# Patient Record
Sex: Male | Born: 1965 | Race: Black or African American | Hispanic: No
Health system: Southern US, Community
[De-identification: ages and names within clinical notes are randomized; demographics above are authoritative.]

---

## 2009-12-15 ENCOUNTER — Inpatient Hospital Stay: Payer: Self-pay | Admitting: Internal Medicine

## 2010-02-17 ENCOUNTER — Emergency Department: Payer: Self-pay | Admitting: Emergency Medicine

## 2011-01-23 IMAGING — CR DG CHEST 1V PORT
1 series · 1 of 1 positions shown · non-contrast
Comparison: none

REASON FOR EXAM: weakness, elevated troponin
COMMENTS:

PROCEDURE:     DXR - DXR PORTABLE CHEST SINGLE VIEW  - December 15, 2009  [DATE]
RESULT:     Comparison: None

[view not recorded]
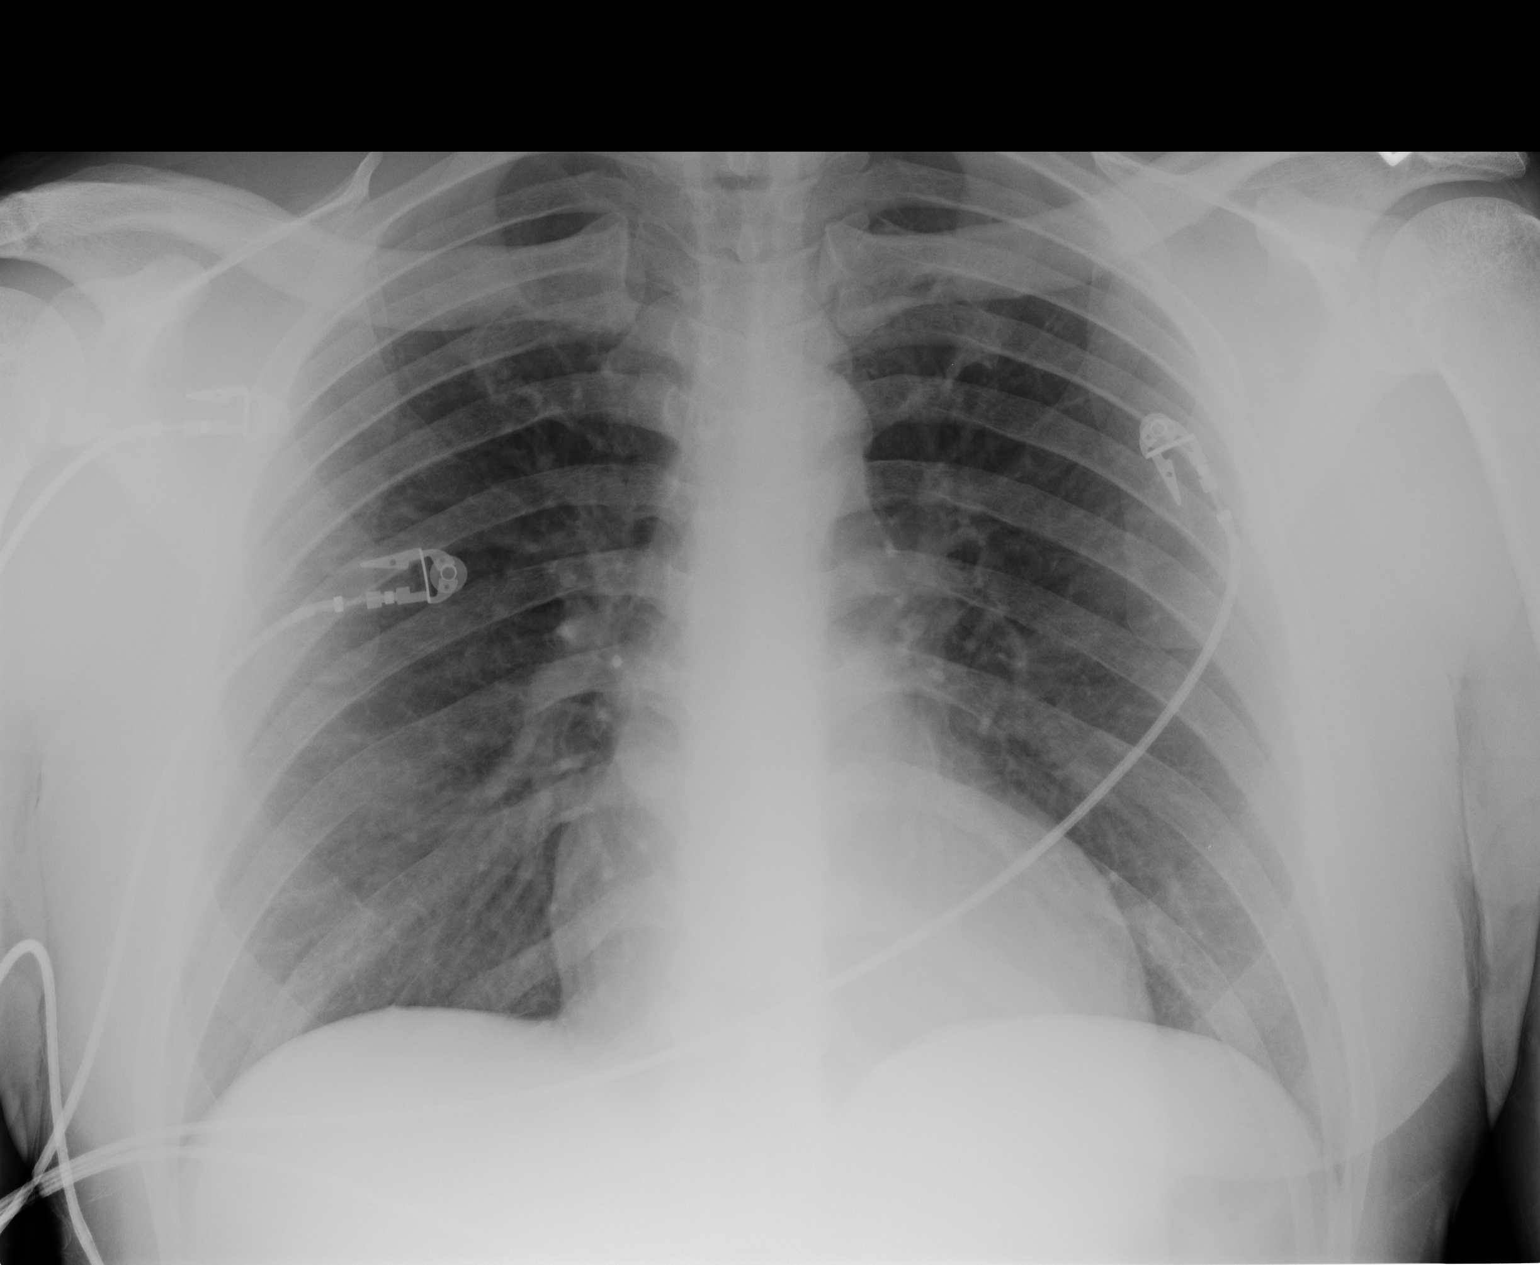

[1 of 1 positions shown; findings below may reference images not displayed]

FINDINGS: Single portable AP chest radiograph is provided. There is no focal
parenchymal opacity, pleural effusion, or pneumothorax. Normal
cardiomediastinal silhouette. The osseous structures are unremarkable.
IMPRESSION: No acute disease of the chest.

## 2011-06-01 ENCOUNTER — Emergency Department: Payer: Self-pay

## 2011-12-27 ENCOUNTER — Observation Stay: Payer: Self-pay | Admitting: Internal Medicine

## 2011-12-27 LAB — DRUG SCREEN, URINE
Amphetamines, Ur Screen: NEGATIVE (ref ?–1000)
Barbiturates, Ur Screen: NEGATIVE (ref ?–200)
Benzodiazepine, Ur Scrn: NEGATIVE (ref ?–200)
Cannabinoid 50 Ng, Ur ~~LOC~~: NEGATIVE (ref ?–50)
Methadone, Ur Screen: NEGATIVE (ref ?–300)
Opiate, Ur Screen: NEGATIVE (ref ?–300)
Phencyclidine (PCP) Ur S: NEGATIVE (ref ?–25)
Tricyclic, Ur Screen: NEGATIVE (ref ?–1000)

## 2011-12-27 LAB — COMPREHENSIVE METABOLIC PANEL
Alkaline Phosphatase: 82 U/L (ref 50–136)
Bilirubin,Total: 0.2 mg/dL (ref 0.2–1.0)
Calcium, Total: 8.6 mg/dL (ref 8.5–10.1)
Chloride: 106 mmol/L (ref 98–107)
Co2: 29 mmol/L (ref 21–32)
EGFR (African American): >60
EGFR (Non-African Amer.): >60
Glucose: 97 mg/dL (ref 65–99)
SGOT(AST): 28 U/L (ref 15–37)
SGPT (ALT): 32 U/L
Sodium: 143 mmol/L (ref 136–145)

## 2011-12-27 LAB — CBC
MCH: 28.3 pg (ref 26.0–34.0)
MCHC: 33.8 g/dL (ref 32.0–36.0)
MCV: 84 fL (ref 80–100)
RBC: 5.13 10*6/uL (ref 4.40–5.90)
WBC: 5.1 10*3/uL (ref 3.8–10.6)

## 2011-12-27 LAB — HEMOGLOBIN A1C: Hemoglobin A1C: 5.9 % (ref 4.2–6.3)

## 2011-12-27 LAB — CK TOTAL AND CKMB (NOT AT ARMC)
CK, Total: 178 U/L (ref 35–232)
CK, Total: 147 U/L (ref 35–232)
CK-MB: 2.5 ng/mL (ref 0.5–3.6)

## 2011-12-27 LAB — TROPONIN I
Troponin-I: 0.75 ng/mL — ABNORMAL HIGH
Troponin-I: 0.7 ng/mL — ABNORMAL HIGH

## 2011-12-28 LAB — LIPID PANEL
Cholesterol: 147 mg/dL (ref 0–200)
HDL Cholesterol: 28 mg/dL — ABNORMAL LOW (ref 40–60)
Ldl Cholesterol, Calc: 76 mg/dL (ref 0–100)
Triglycerides: 217 mg/dL — ABNORMAL HIGH (ref 0–200)
VLDL Cholesterol, Calc: 43 mg/dL — ABNORMAL HIGH (ref 5–40)

## 2011-12-28 LAB — CK TOTAL AND CKMB (NOT AT ARMC)
CK, Total: 125 U/L (ref 35–232)
CK-MB: 2.3 ng/mL (ref 0.5–3.6)

## 2011-12-28 LAB — TROPONIN I: Troponin-I: 0.7 ng/mL — ABNORMAL HIGH

## 2013-02-03 IMAGING — CR DG CHEST 2V
1 series · 2 of 2 positions shown · non-contrast
Comparison: none

REASON FOR EXAM: palpitations
COMMENTS:   May transport without cardiac monitor

PROCEDURE:     DXR - DXR CHEST PA (OR AP) AND LATERAL  - December 27, 2011 [DATE]
RESULT:     Comparison is made to the prior exam of 12/15/2009. The lung
fields are clear. The heart, mediastinal and osseous structures show no
significant abnormalities.

[Series 1: pa · 0.17mm/px · 2 of 2 slices shown]
[im 1/2]
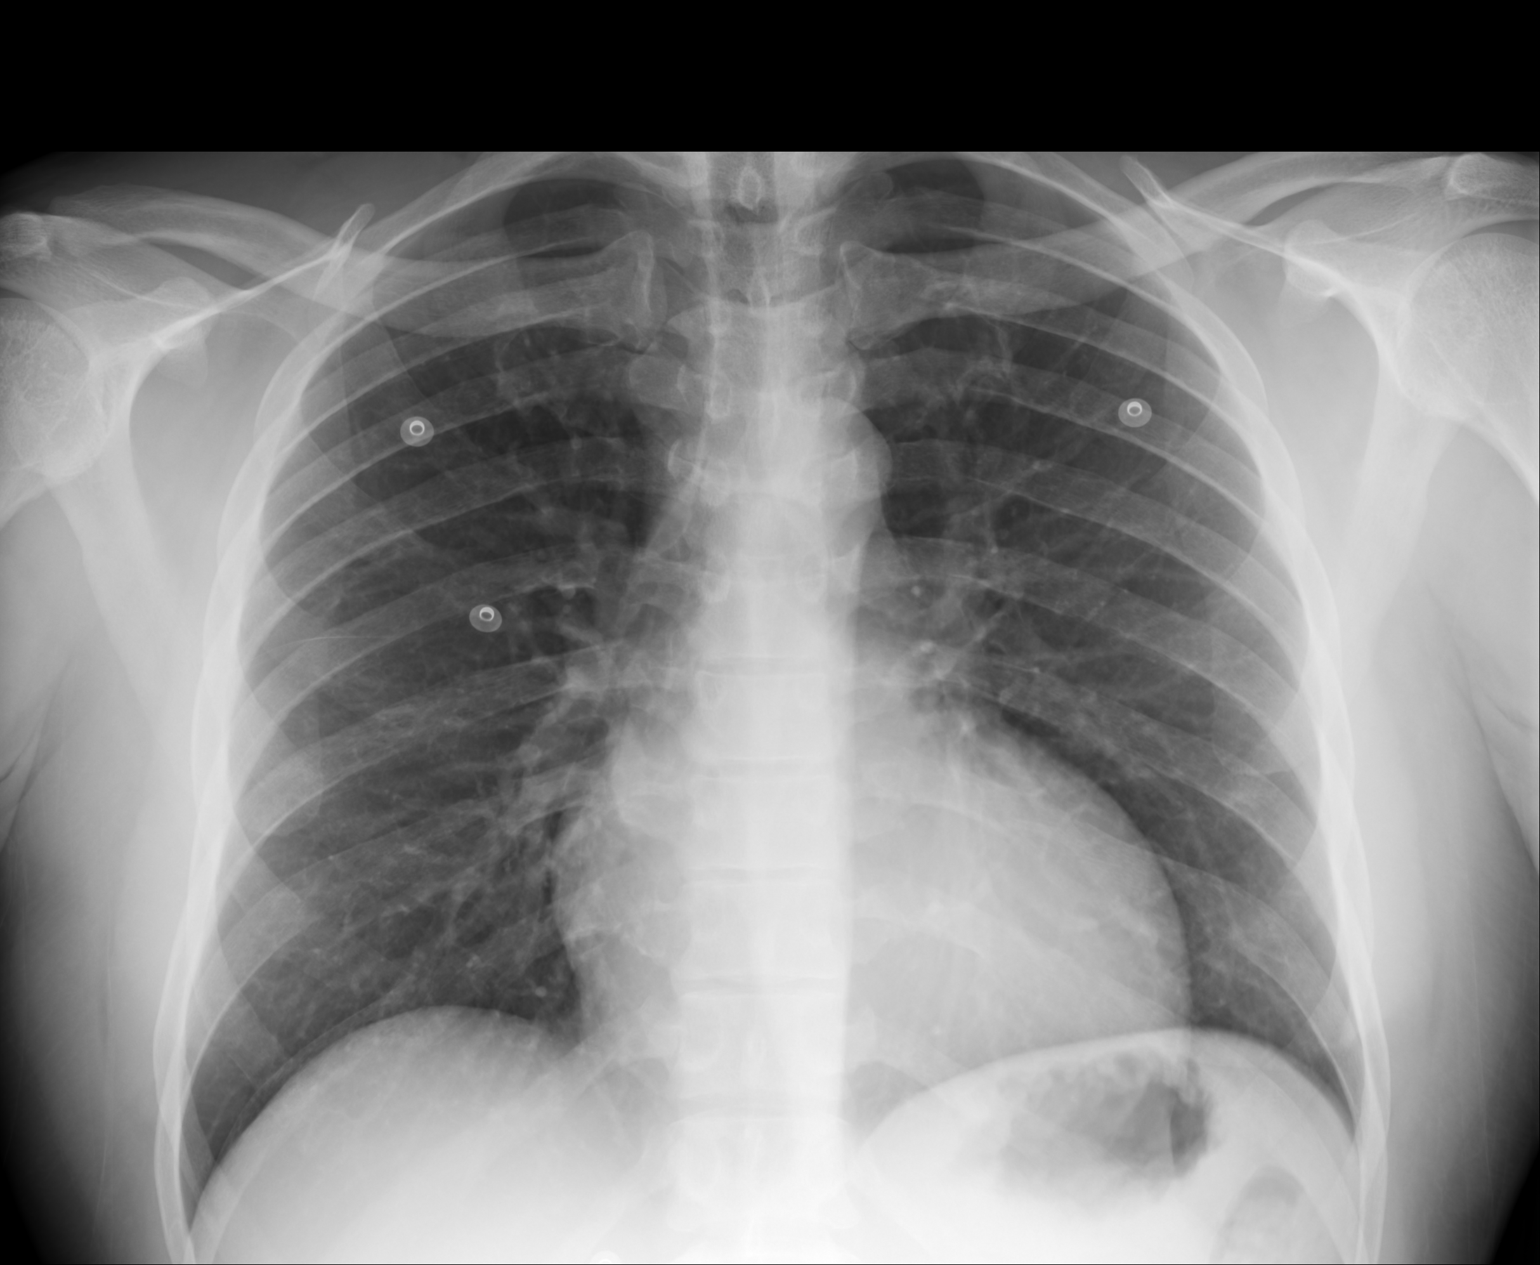
[im 2/2]
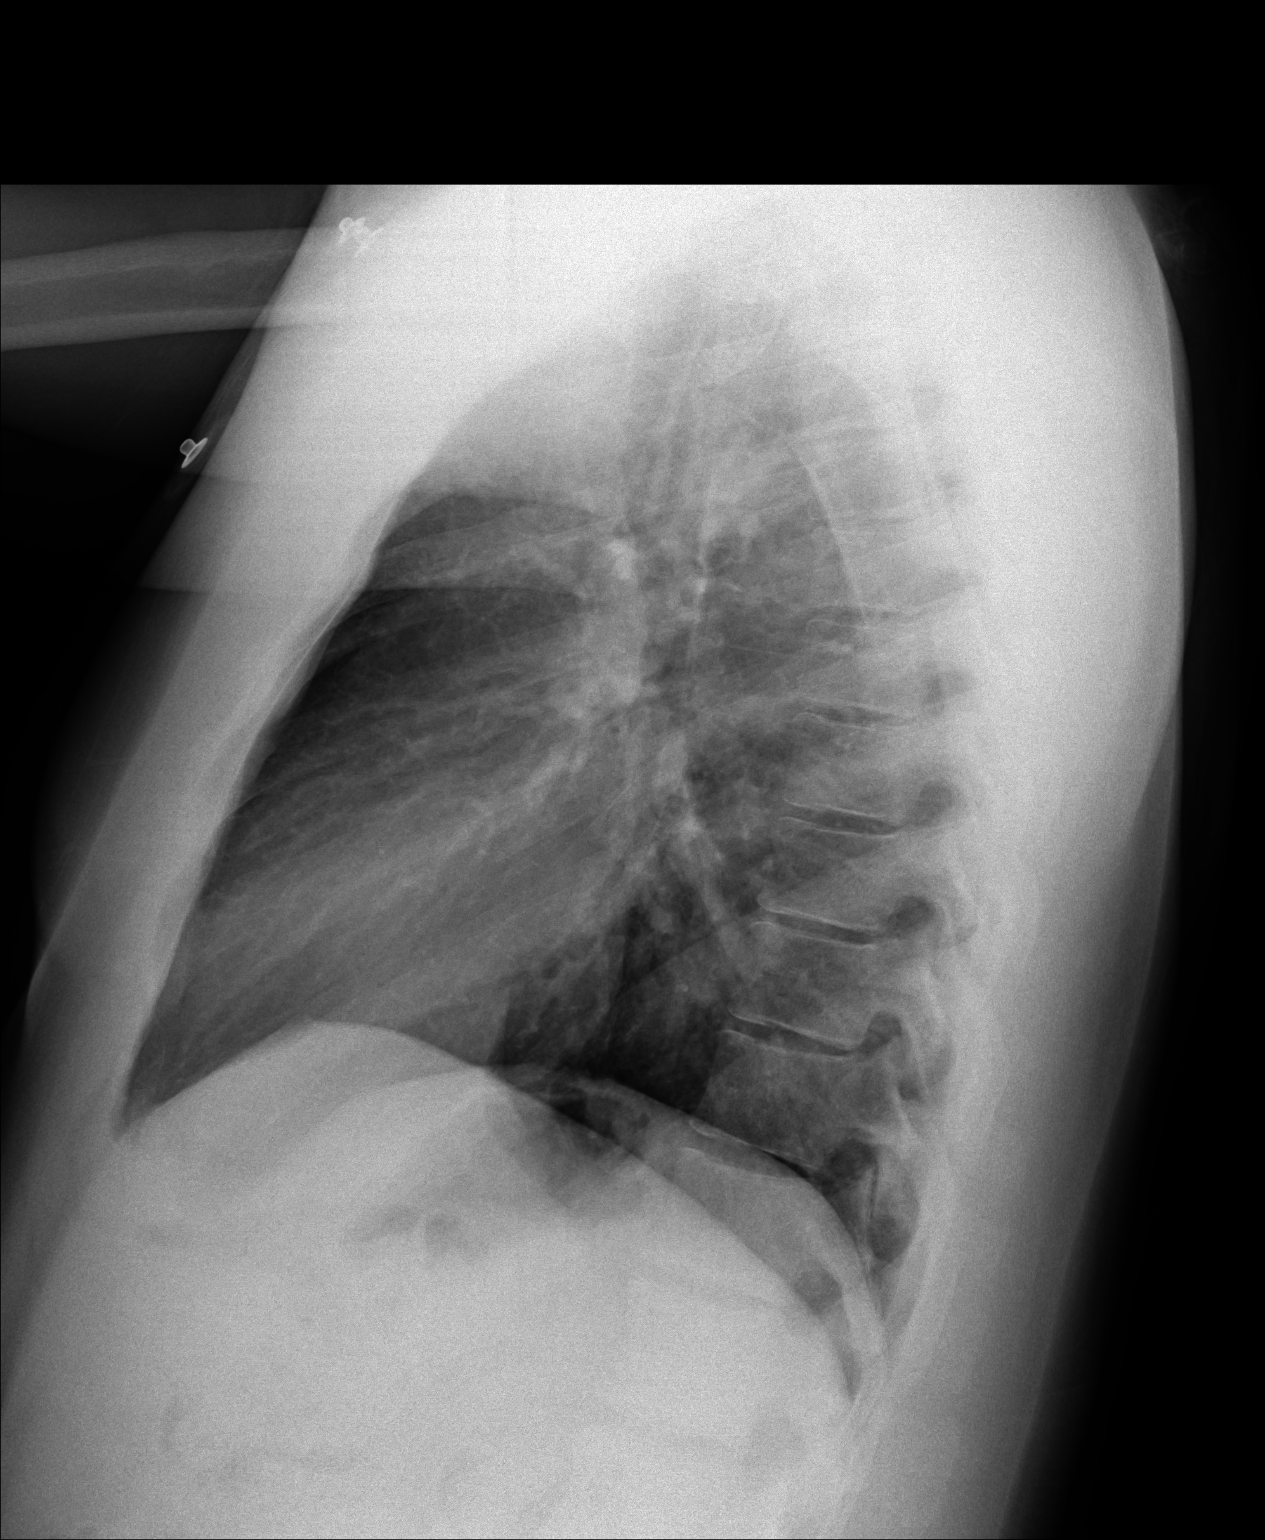

[2 of 2 positions shown; findings below may reference images not displayed]

IMPRESSION: 1.     No acute changes are identified.

## 2015-03-15 NOTE — Discharge Summary (Signed)
PATIENT NAME:  Kurt Reed, Kurt Reed MR#:  [id number] DATE OF BIRTH:  [birth year]  DATE OF ADMISSION:  12/27/2011 DATE OF DISCHARGE:  12/28/2011  ADMITTING PHYSICIAN: Dr. Hilda LiasVivek Sainani.  DISCHARGING PHYSICIAN: Dr. Larena GlassmanAmir Americus Perkey.  PRIMARY CARE PHYSICIAN: None.   ADMITTING DIAGNOSIS: Palpitations, shortness of breath.   DISCHARGE DIAGNOSES:  1. Palpitations. 2. Shortness of breath. 3. Malignant hypertension. 4. 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  5. Smoking. 6. Gastroesophageal reflux disease.   CONSULTANTS:  1. Dr. Juliann Paresallwood. 2. Case management.   TESTS DONE DURING THIS HOSPITALIZATION:  1. Chest x-ray 02/05 showed no acute changes identified. 2. Echo Doppler was done by Dr. Juliann Paresallwood who stated this was no significant finding, but still waiting for official report.   HOSPITAL COURSE: Initial history and physical were done by Dr. Cherlynn KaiserSainani. Please refer to his note dated 12/27/2011 for complete details. In brief, this is a 49 year old African American male with past medical history of cocaine abuse, smoking, alcohol, who presents with intermittent shortness of breath and palpitations. The patient has a history of cocaine and alcohol abuse. He had an ongoing detox program at [facility] who was admitted to the hospitalist service.  1. For his shortness of breath, this resolved and he did have elevated troponin. I am not sure if this was anginal equivalent, but according to Dr. Juliann Paresallwood, he did not believe so. He had aspirin, beta blocker, LDL 76. The patient's serial cardiac enzymes showed some mild troponin leak which may have been from demand ischemia from malignant hypertension. The patient was recommended to continue to abstain from cocaine as he was on beta blocker. 2. Malignant hypertension. The patient did not take any medications. He has a history of hypertension. Restarted on beta blocker and advised him carefully. He did receive some p.r.n. hydralazine and now his blood pressure has been  fairly well controlled with beta blocker.  3. History of cocaine and alcohol abuse. The patient is currently on detox program. 4. Palpitations, shortness of breath. This resolved.  5. Smoking. The patient was counseled about cessation for over three minutes.  6. Deep venous thrombosis prophylaxis. The patient was ambulatory and on aspirin.  7. Gastroesophageal reflux disease. Started on Zantac.   CONDITION ON DISCHARGE: The patient was discharged on 12/28/2011. Temperature 96.7, heart rate 55, respiratory rate 18, blood pressure 118/74, sating 98% on room air. LUNGS: Clear to auscultation. CARDIOVASCULAR: Regular rate and rhythm. ABDOMEN: Benign.   DISCHARGE MEDICATIONS:  1. Aspirin 81 mg daily.  2. Zantac 150 mg daily. 3. Metoprolol 25 mg twice daily.   OXYGEN: None.   DIET: Low sodium.   ACTIVITY: As tolerated.   FOLLOWUP: The patient should see [facility] in one week, see Dr. Juliann Paresallwood in two weeks.   CODE STATUS: FULL CODE.    [facility].   Thank you for allowing me to participate in the care of this patient.       TOTAL TIME SPENT ON DISCHARGE: 45 minutes.    ____________________________ Corie ChiquitoAmir A. Lafayette DragonFirozvi, MD aaf:ap Reed: 12/29/2011 10:24:09 ET T: 12/29/2011 10:36:27 ET JOB#: [id number]  cc: Karolee OhsAmir A. Lafayette DragonFirozvi, MD, <Dictator> [facility]  Dwayne Reed. Juliann Paresallwood, MD Coralyn PearAMIR A Vangie Henthorn MD ELECTRONICALLY SIGNED 12/29/2011 14:17

## 2015-03-15 NOTE — Consult Note (Signed)
PATIENT NAME:  Kurt Reed, [name] MR#:  [id number] DATE OF BIRTH:  December 13, 1965  DATE OF CONSULTATION:  12/28/2011  REFERRING PHYSICIAN:   Rolly PancakeVivek J. Cherlynn KaiserSainani, MD with [facility] CONSULTING PHYSICIAN:  Hodaya Curto D. Vena Bassinger, MD  REASON FOR CONSULTATION: Possible non-Q-wave myocardial infarction, shortness of breath, palpitations.   HISTORY OF PRESENT ILLNESS: Mr. Kurt Reed is a 49 year old African American male who presents to the hospital with intermittent episodes of chest pain and palpitations. The patient has a history of cocaine and alcohol abuse, currently going through detox and treatment. He had been doing well until a few days ago when he started having intermittent episodes of shortness of breath and palpitations lasting only for a few seconds and going away. He got very concerned and came to Emergency Room for further evaluation. He was pain-free when he came to the ER, blood pressure and heart rate were okay; and he denied any significant chest pain, but with a previous history he decided to come for further evaluation.   REVIEW OF SYSTEMS: He denies blackout spells or syncope. He denies fever, chills, or sweats. He denies weight loss, weight gain. No hemoptysis or hematemesis. He denies bright red blood per rectum. No vision change or hearing change. Denies sputum production or cough.   PAST MEDICAL HISTORY:  1. Hypertension. 2. Sickle cell trait. 3. History of substance abuse.  4. Hypertension.  5. Smoking.   ALLERGIES: Lisinopril.  FAMILY HISTORY: Alcohol abuse, diabetes.   SOCIAL HISTORY: He smokes. He has some alcohol abuse and substance abuse history. He is currently unemployed.   MEDICATIONS: None.   PHYSICAL EXAMINATION:  VITAL SIGNS: Blood pressure 160/80, pulse 70, respiratory rate 18, afebrile.   HEENT: Normocephalic, atraumatic. Pupils are equal, round and reactive to light.    NECK: Supple. No significant jugular venous distention, bruits or adenopathy.   LUNGS: Clear to  auscultation and percussion. No rhonchi or rales.   HEART: Regular rate and rhythm. Positive S4. Systolic ejection murmur at the apex.   ABDOMEN: Exam is benign.   EXTREMITIES: Exam is within normal limits.   NEUROLOGIC: Exam is intact.   SKIN: Exam is normal.   LABORATORY, DIAGNOSTIC AND RADIOLOGICAL DATA:  Glucose 97, BUN 15, creatinine 1.1, sodium 143, potassium 4.7, bicarbonate 29.  LFTs negative.  Troponin 0.75. CK 178.  White count 5.1, hemoglobin 14.5, hematocrit 43, platelet count 170.  EKG: Normal sinus rhythm, left ventricular hypertrophy, nonspecific ST-T wave changes.   ASSESSMENT:  1. Substance abuse. 2. Cocaine abuse. 3. Chest pain, elevated troponin, shortness of breath.  4. Malignant hypertension.  5. Alcohol abuse.  6. Abnormal EKG.   PLAN:  1. I agree with admit, rule out for myocardial infarction.  2. Follow cardiac enzymes. Follow-up EKG. Follow troponins.  3. Continue blood pressure control.  4. I advised the patient to quit smoking.  5. Lipid management. 6. Echocardiogram may be helpful.  7. We will consider further evaluation depending on troponins and cardiac enzymes studies.  8. Would increase activity if he remains pain free.   ____________________________ Bobbie Stackwayne D. Juliann Paresallwood, MD ddc:cbb D: 01/02/2012 13:49:00 ET T: 01/02/2012 14:04:51 ET JOB#: [id number]  cc: Tiea Manninen D. Juliann Paresallwood, MD, <Dictator>  Alwyn PeaWAYNE D Sissi Padia MD ELECTRONICALLY SIGNED 01/19/2012 7:34

## 2015-03-15 NOTE — Consult Note (Signed)
PATIENT NAME:  Kurt Reed, Kurt Reed MR#:  [id number] DATE OF BIRTH:  1966/05/03  DATE OF CONSULTATION:  12/27/2011  REFERRING PHYSICIAN:   Rolly PancakeVivek J. Cherlynn KaiserSainani, MD with [facility] CONSULTING PHYSICIAN:  [employer] Reed. Audley Hinojos, MD  REASON FOR CONSULTATION: Possible non-Q-wave myocardial infarction, shortness of breath, palpitations.   HISTORY OF PRESENT ILLNESS: Kurt Reed is a 49 year old African American male who presents to the hospital with intermittent episodes of chest pain and palpitations. The patient has a history of cocaine and alcohol abuse, currently going through detox and treatment. He had been doing well until a few days ago when he started having intermittent episodes of shortness of breath and palpitations lasting only for a few seconds and going away. He got very concerned and came to Emergency Room for further evaluation. He was pain-free when he came to the ER, blood pressure and heart rate were okay; and he denied any significant chest pain, but with a previous history he decided to come for further evaluation.   REVIEW OF SYSTEMS: He denies blackout spells or syncope. He denies fever, chills, or sweats. He denies weight loss, weight gain. No hemoptysis or hematemesis. He denies bright red blood per rectum. No vision change or hearing change. Denies sputum production or cough.   PAST MEDICAL HISTORY:  1. Hypertension. 2. Sickle cell trait. 3. History of substance abuse.  4. Hypertension.  5. Smoking.   ALLERGIES: Lisinopril.  FAMILY HISTORY: Alcohol abuse, diabetes.   SOCIAL HISTORY: He smokes. He has some alcohol abuse and substance abuse history. He is currently unemployed.   MEDICATIONS: None.   PHYSICAL EXAMINATION:  VITAL SIGNS: Blood pressure 160/80, pulse 70, respiratory rate 18, afebrile.   HEENT: Normocephalic, atraumatic. Pupils are equal, round and reactive to light.    NECK: Supple. No significant jugular venous distention, bruits or adenopathy.   LUNGS: Clear to  auscultation and percussion. No rhonchi or rales.   HEART: Regular rate and rhythm. Positive S4. Systolic ejection murmur at the apex.   ABDOMEN: Exam is benign.   EXTREMITIES: Exam is within normal limits.   NEUROLOGIC: Exam is intact.   SKIN: Exam is normal.   LABORATORY, DIAGNOSTIC AND RADIOLOGICAL DATA:  Glucose 97, BUN 15, creatinine 1.1, sodium 143, potassium 4.7, bicarbonate 29.  LFTs negative.  Troponin 0.75. CK 178.  White count 5.1, hemoglobin 14.5, hematocrit 43, platelet count 170.  EKG: Normal sinus rhythm, left ventricular hypertrophy, nonspecific ST-T wave changes.   ASSESSMENT:  1. Substance abuse. 2. Cocaine abuse. 3. Chest pain, elevated troponin, shortness of breath.  4. Malignant hypertension.  5. Alcohol abuse.  6. Abnormal EKG.   PLAN:  1. I agree with admit, rule out for myocardial infarction.  2. Follow cardiac enzymes. Follow-up EKG. Follow troponins.  3. Continue blood pressure control.  4. I advised the patient to quit smoking.  5. Lipid management. 6. Echocardiogram may be helpful.  7. We will consider further evaluation depending on troponins and cardiac enzymes studies.  8. Would increase activity if he remains pain free.   ____________________________ Bobbie Stackwayne Reed. Juliann Paresallwood, MD ddc:cbb Reed: 01/02/2012 13:49:08 ET T: 01/02/2012 14:04:51 ET JOB#: [id number]  cc: Ed Mandich Reed. Mohmed Farver, MD, <Dictator>

## 2015-03-15 NOTE — H&P (Signed)
PATIENT NAME:  Kurt Reed, ADAMEC MR#:  [id number] DATE OF BIRTH:  July 15, 1966  DATE OF ADMISSION:  12/27/2011  PRIMARY CARE PHYSICIAN: Does not have one.   CHIEF COMPLAINT: Palpitations, shortness of breath.   HISTORY OF PRESENT ILLNESS: This is a 49 year old male who presents to the hospital with intermittent episodes of shortness of breath and palpitations. The patient has a history of cocaine and alcohol abuse and is currently going through a detox program at [place]. He had been doing well until the past few days. He has been having intermittent episodes of shortness of breath and palpitations. They only last a few seconds and they go away. He was a bit concerned and, therefore, came to the ER for further evaluation. Presently he is chest pain free and hemodynamically stable. He never really had any chest pain. He denied any cough, any other upper respiratory symptoms, any sick contacts, any fevers.   Upon arrival to the ER, the patient was noted to have significantly elevated blood pressures and also noted to have a slightly elevated troponin. Hospitalist service was then contacted for further treatment and evaluation.   REVIEW OF SYSTEMS: CONSTITUTIONAL: No documented fever. No weight gain, no weight loss. HEENT: No blurry or double vision. ENT: No tinnitus or postnasal drip. No redness of the oropharynx. RESPIRATORY: No cough, no wheeze, no hemoptysis. CARDIOVASCULAR: No chest pain, no orthopnea, no palpitations, no syncope. GI: No nausea, no vomiting, no diarrhea, no abdominal pain, no melena, no hematochezia. GU: No dysuria or hematuria. ENDOCRINE: No polyuria or nocturia. No heat or cold intolerance. HEME: No anemia, no bruising, no bleeding. INTEGUMENTARY: No rashes, no lesions. MUSCULOSKELETAL: No arthritis, no swelling, no gout. NEUROLOGIC: No numbness, no tingling, no ataxia, no seizure-type activity. PSYCH: No anxiety, no insomnia, no ADD.   PAST MEDICAL HISTORY:  1. Hypertension. 2. Sickle  cell trait. 3. Substance abuse with history of cocaine and alcohol abuse.   ALLERGIES: Lisinopril which causes angioedema.   SOCIAL HISTORY: He does smoke a few cigarettes every other day. Has been smoking for the past 15 to 20 years. Used to drink heavily, has been clean for about two weeks. Also did cocaine about two weeks or so ago.   FAMILY HISTORY: Father died from history of alcohol abuse. Mother died from complications of diabetes.   CURRENT MEDICATIONS: He is currently on no medications.   PHYSICAL EXAMINATION ON ADMISSION:   VITAL SIGNS: Temperature 97.5, pulse 69, respirations 20, blood pressure 160/85, sats 99% on room air.   GENERAL: He is a pleasant appearing male in no apparent distress.   HEENT: Atraumatic, normocephalic. Extraocular muscles are intact. Pupils equal and reactive to light. Sclerae anicteric. No conjunctival injection. No oropharyngeal erythema.   NECK: Supple. No jugular venous distention, no bruits, no lymphadenopathy, no thyromegaly.   HEART: Regular rate and rhythm. No murmurs, no rubs, no clicks.   LUNGS: Clear to auscultation bilaterally. No rales, no rhonchi, no wheezes.   ABDOMEN: Soft, flat, nontender, nondistended. Has good bowel sounds. No hepatosplenomegaly appreciated.   EXTREMITIES: No evidence of any cyanosis, clubbing, or peripheral edema. Has +2 pedal and radial pulses bilaterally.   NEUROLOGIC: The patient is alert, awake, and oriented x3 with no focal motor or sensory deficits appreciated bilaterally.   SKIN: Moist and warm with no rashes appreciated.   LYMPHATIC: There is no cervical or axillary lymphadenopathy.   LABORATORY, DIAGNOSTIC, AND RADIOLOGICAL DATA: Serum glucose 97, BUN 15, creatinine 1.1, sodium 143, potassium 4.7, chloride 106,  bicarb 29. LFTs are within normal limits. Troponin 0.75. CK 178. White cell count 5.1, hemoglobin 14.5, hematocrit 43, platelet count 170.   The patient did have an EKG done which shows normal  sinus rhythm with normal axis and LVH with voltage criteria with ST changes consistent with LVH.   ASSESSMENT AND PLAN: This is a 49 year old male with history of substance abuse of cocaine and alcohol and sickle cell trait who presents to the hospital with palpitations, shortness of breath and noted to have an elevated troponin.  1. Shortness of breath/elevated troponin. Questionable if this is an anginal equivalent given the patient's elevated troponin although he has no chest pain. He does have a history of cocaine abuse and tobacco abuse and does have significant risk factors. His EKG shows LVH with ST changes consistent with LVH. I will observe him on telemetry for now. Continue to check serial cardiac markers. Continue aspirin, beta-blocker, some sublingual nitroglycerin if needed. Also check a lipid profile. Will also obtain a Cardiology consult.  2. Malignant hypertension. The patient does have a history of hypertension but is not taking any meds. I will start him on some p.o. metoprolol for now and also some p.r.n. hydralazine. He will likely need to be on some meds prior to discharge, likely generic, given the fact that he does not have any insurance.  3. History of cocaine and alcohol abuse. He has been clean for about two weeks. Will await a urine tox screen. He does not show any evidence of alcohol withdrawal presently or even cocaine withdrawal.   CODE STATUS: The patient is a FULL CODE.   TIME SPENT: 45 minutes.  ____________________________ Rolly PancakeVivek J. Cherlynn KaiserSainani, MD vjs:drc D: 12/27/2011 12:48:44 ET T: 12/27/2011 13:34:43 ET JOB#: [id number]  cc: Rolly PancakeVivek J. Cherlynn KaiserSainani, MD, <Dictator> Houston SirenVIVEK J Jaidan Prevette MD ELECTRONICALLY SIGNED 12/27/2011 14:32

## 2015-03-15 NOTE — Consult Note (Signed)
Brief Consult Note: Diagnosis: Elevated troponin/possible nqmi/palp/tachy.   Patient was seen by consultant.   Consult note dictated.   Orders entered.   Discussed with Attending MD.   Comments: IMP nqmi elevated troponin htn dm smoking palpitation Tachycardia Substance abuse . Plan asa Continue to refrain from ETOH No Cocaine DM control with diet Bp control Agree with zantac for GERD [facility]  F/U as outpt.  Electronic Signatures: Dorothyann Pengallwood, Dwayne D (MD)  (Signed 06-Feb-13 16:48)  Authored: Brief Consult Note   Last Updated: 06-Feb-13 16:48 by Dorothyann Pengallwood, Dwayne D (MD)
